# Patient Record
Sex: Male | Born: 1970 | Race: White | Hispanic: No | Marital: Married | Smoking: Never smoker
Health system: Southern US, Community
[De-identification: ages and names within clinical notes are randomized; demographics above are authoritative.]

## PROBLEM LIST (undated history)

## (undated) HISTORY — PX: NO PAST SURGERIES: SHX2092

---

## 2016-03-16 ENCOUNTER — Ambulatory Visit (INDEPENDENT_AMBULATORY_CARE_PROVIDER_SITE_OTHER): Payer: BLUE CROSS/BLUE SHIELD | Admitting: Otolaryngology

## 2016-03-16 DIAGNOSIS — J330 Polyp of nasal cavity: Secondary | ICD-10-CM

## 2016-03-16 DIAGNOSIS — J342 Deviated nasal septum: Secondary | ICD-10-CM

## 2016-03-16 DIAGNOSIS — J310 Chronic rhinitis: Secondary | ICD-10-CM | POA: Diagnosis not present

## 2016-03-16 DIAGNOSIS — J343 Hypertrophy of nasal turbinates: Secondary | ICD-10-CM | POA: Diagnosis not present

## 2016-03-18 ENCOUNTER — Other Ambulatory Visit (INDEPENDENT_AMBULATORY_CARE_PROVIDER_SITE_OTHER): Payer: Self-pay | Admitting: Otolaryngology

## 2016-03-18 DIAGNOSIS — J328 Other chronic sinusitis: Secondary | ICD-10-CM

## 2016-04-09 ENCOUNTER — Ambulatory Visit
Admission: RE | Admit: 2016-04-09 | Discharge: 2016-04-09 | Disposition: A | Discharge status: Home / Self Care | Payer: BLUE CROSS/BLUE SHIELD | Source: Ambulatory Visit | Attending: Otolaryngology | Admitting: Otolaryngology

## 2016-04-09 DIAGNOSIS — J328 Other chronic sinusitis: Secondary | ICD-10-CM

## 2017-12-15 DIAGNOSIS — J209 Acute bronchitis, unspecified: Secondary | ICD-10-CM | POA: Diagnosis not present

## 2018-03-18 DIAGNOSIS — Z23 Encounter for immunization: Secondary | ICD-10-CM | POA: Diagnosis not present

## 2018-05-04 DIAGNOSIS — R0681 Apnea, not elsewhere classified: Secondary | ICD-10-CM | POA: Diagnosis not present

## 2018-05-04 DIAGNOSIS — G2581 Restless legs syndrome: Secondary | ICD-10-CM | POA: Diagnosis not present

## 2019-03-28 ENCOUNTER — Ambulatory Visit: Payer: Medicaid Other | Attending: Internal Medicine

## 2019-03-28 DIAGNOSIS — Z20822 Contact with and (suspected) exposure to covid-19: Secondary | ICD-10-CM

## 2019-03-29 LAB — NOVEL CORONAVIRUS, NAA: SARS-CoV-2, NAA: NOT DETECTED

## 2019-12-06 ENCOUNTER — Telehealth (HOSPITAL_COMMUNITY): Payer: Self-pay

## 2019-12-06 ENCOUNTER — Other Ambulatory Visit: Payer: Self-pay | Admitting: Family

## 2019-12-06 ENCOUNTER — Ambulatory Visit (HOSPITAL_COMMUNITY)
Admission: RE | Admit: 2019-12-06 | Discharge: 2019-12-06 | Disposition: A | Discharge status: Home / Self Care | Payer: 59 | Source: Ambulatory Visit | Attending: Pulmonary Disease | Admitting: Pulmonary Disease

## 2019-12-06 DIAGNOSIS — U071 COVID-19: Secondary | ICD-10-CM | POA: Diagnosis not present

## 2019-12-06 DIAGNOSIS — Z6825 Body mass index (BMI) 25.0-25.9, adult: Secondary | ICD-10-CM | POA: Insufficient documentation

## 2019-12-06 MED ORDER — METHYLPREDNISOLONE SODIUM SUCC 125 MG IJ SOLR: 125.0000 mg | Freq: Once | INTRAMUSCULAR | Status: DC | PRN

## 2019-12-06 MED ORDER — DIPHENHYDRAMINE HCL 50 MG/ML IJ SOLN: 50.0000 mg | Freq: Once | INTRAMUSCULAR | Status: DC | PRN

## 2019-12-06 MED ORDER — EPINEPHRINE 0.3 MG/0.3ML IJ SOAJ: 0.3000 mg | Freq: Once | INTRAMUSCULAR | Status: DC | PRN

## 2019-12-06 MED ORDER — FAMOTIDINE IN NACL 20-0.9 MG/50ML-% IV SOLN: 20.0000 mg | Freq: Once | INTRAVENOUS | Status: DC | PRN

## 2019-12-06 MED ORDER — SODIUM CHLORIDE 0.9 % IV SOLN: INTRAVENOUS | Status: DC | PRN

## 2019-12-06 MED ORDER — SODIUM CHLORIDE 0.9 % IV SOLN: Freq: Once | INTRAVENOUS | Status: AC

## 2019-12-06 MED ORDER — ALBUTEROL SULFATE HFA 108 (90 BASE) MCG/ACT IN AERS: 2.0000 | INHALATION_SPRAY | Freq: Once | RESPIRATORY_TRACT | Status: DC | PRN

## 2019-12-06 NOTE — Discharge Instructions (Signed)
10 Things You Can Do to Manage Your COVID-19 Symptoms at Home If you have possible or confirmed COVID-19: 1. Stay home from work and school. And stay away from other public places. If you must go out, avoid using any kind of public transportation, ridesharing, or taxis. 2. Monitor your symptoms carefully. If your symptoms get worse, call your healthcare provider immediately. 3. Get rest and stay hydrated. 4. If you have a medical appointment, call the healthcare provider ahead of time and tell them that you have or may have COVID-19. 5. For medical emergencies, call 911 and notify the dispatch personnel that you have or may have COVID-19. 6. Cover your cough and sneezes with a tissue or use the inside of your elbow. 7. Wash your hands often with soap and water for at least 20 seconds or clean your hands with an alcohol-based hand sanitizer that contains at least 60% alcohol. 8. As much as possible, stay in a specific room and away from other people in your home. Also, you should use a separate bathroom, if available. If you need to be around other people in or outside of the home, wear a mask. 9. Avoid sharing personal items with other people in your household, like dishes, towels, and bedding. 10. Clean all surfaces that are touched often, like counters, tabletops, and doorknobs. Use household cleaning sprays or wipes according to the label instructions.Mr. What types of side effects do monoclonal antibody drugs cause?  Common side effects  In general, the more common side effects caused by monoclonal antibody drugs include: Allergic reactions, such as hives or itching Flu-like signs and symptoms, including chills, fatigue, fever, and muscle aches and pains Nausea, vomiting Diarrhea Skin rashes Low blood pressure   The CDC is recommending patients who receive monoclonal antibody treatments wait at least 90 days before being vaccinated.  11. Currently, there are no data on the safety and  efficacy of mRNA COVID-19 vaccines in persons who received monoclonal antibodies or convalescent plasma as part of COVID-19 treatment. Based on the estimated half-life of such therapies as well as evidence suggesting that reinfection is uncommon in the 90 days after initial infection, vaccination should be deferred for at least 90 days, as a precautionary measure until additional information becomes available, to avoid interference of the antibody treatment with vaccine-induced immune responses. [employer] 08/10/2018 This information is not intended to replace advice given to you by your health care provider. Make sure you discuss any questions you have with your health care provider. Document Revised: 01/12/2019 Document Reviewed: 01/12/2019 Elsevier Patient Education  2020 [employer].

## 2019-12-06 NOTE — Progress Notes (Signed)
I connected by phone with Russell Simmons on 12/06/2019 at 3:30 PM to discuss the potential use of a new treatment for mild to moderate COVID-19 viral infection in non-hospitalized patients.  This patient is a 49 y.o. male that meets the FDA criteria for Emergency Use Authorization of COVID monoclonal antibody casirivimab/imdevimab or bamlanivimab/eteseviamb.  Has a (+) direct SARS-CoV-2 viral test result  Has mild or moderate COVID-19   Is NOT hospitalized due to COVID-19  Is within 10 days of symptom onset  Has at least one of the high risk factor(s) for progression to severe COVID-19 and/or hospitalization as defined in EUA.  Specific high risk criteria : BMI > 25 Symptoms of stuffy nose, aches, decreased appetite, and burnt smell in nose began 12/02/19.   I have spoken and communicated the following to the patient or parent/caregiver regarding COVID monoclonal antibody treatment:  1. FDA has authorized the emergency use for the treatment of mild to moderate COVID-19 in adults and pediatric patients with positive results of direct SARS-CoV-2 viral testing who are 35 years of age and older weighing at least 40 kg, and who are at high risk for progressing to severe COVID-19 and/or hospitalization.  2. The significant known and potential risks and benefits of COVID monoclonal antibody, and the extent to which such potential risks and benefits are unknown.  3. Information on available alternative treatments and the risks and benefits of those alternatives, including clinical trials.  4. Patients treated with COVID monoclonal antibody should continue to self-isolate and use infection control measures (e.g., wear mask, isolate, social distance, avoid sharing personal items, clean and disinfect "high touch" surfaces, and frequent handwashing) according to CDC guidelines.   5. The patient or parent/caregiver has the option to accept or refuse COVID monoclonal antibody treatment.  After  reviewing this information with the patient, the patient has agreed to receive one of the available covid 19 monoclonal antibodies and will be provided an appropriate fact sheet prior to infusion. [name], NP 12/06/2019 3:30 PM

## 2019-12-06 NOTE — Progress Notes (Signed)
  Diagnosis: COVID-19  Physician: Dr. Wright  Procedure: Covid Infusion Clinic Med: bamlanivimab\etesevimab infusion - Provided patient with bamlanimivab\etesevimab fact sheet for patients, parents and caregivers prior to infusion.  Complications: No immediate complications noted.  Discharge: Discharged home   Aireonna Bauer Ann 12/06/2019  

## 2019-12-06 NOTE — Telephone Encounter (Signed)
Called to Discuss with patient about Covid symptoms and the use of the monoclonal antibody infusion for those with mild to moderate Covid symptoms and at a high risk of hospitalization.     Pt appears to qualify for this infusion due to co-morbid conditions and/or a member of an at-risk group in accordance with the FDA Emergency Use Authorization.    Patient interested in treatment.    DAY 4. Pre-screended by RN and ready for APP. Sx onset 10/23. Test (+) 10/25 at [facility]. Risk Factor: BMI over 25.

## 2020-09-17 DIAGNOSIS — H9313 Tinnitus, bilateral: Secondary | ICD-10-CM | POA: Insufficient documentation

## 2020-09-17 DIAGNOSIS — H833X3 Noise effects on inner ear, bilateral: Secondary | ICD-10-CM | POA: Insufficient documentation

## 2020-10-08 NOTE — Progress Notes (Signed)
Subjective:    CC: R shoulder pain  I, Russell Simmons, LAT, ATC, am serving as scribe for Dr. Clementeen Graham.  HPI: Pt is a 50 y/o male presenting w/ c/o R shoulder pain since Jan 2022 w/ no known MOI.  He locates his pain to his R ant shoulder.  He has completed 8 PT sessions at [facility] and has been referred to [place] by their office.  Physical therapy started in May 2022.  He has had to stop riding his mountain bike because of the shoulder plane.  Additionally he has had to stop of a Database administrator in his backyard.  He is building a large shed and cannot complete it because shoulder pain.  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    Radiating pain: No R shoulder mechanical symptoms: yes intermittently.  Patient notes some clunking and clicking in his shoulder at times. Aggravating factors: R shoulder extension AROM; washing his hands;  Treatments tried: PT x 8 visits; HEP  Pertinent review of Systems: No fevers or chills  Relevant historical information: Otherwise healthy   Objective:    Vitals:   10/09/20 0920  BP: 120/86  Pulse: 100  SpO2: 98%   General: Well Developed, well nourished, and in no acute distress.   MSK: Right shoulder normal-appearing Normal motion pain with abduction and internal rotation. Strength intact abduction external and internal rotation. Positive empty can test. Positive Hawkins and Neer's test. Positive crossover arm compression test. Positive O'Brien's test. Positive clunk test.  Pulses cap refill and sensation are intact distally.  Lab and Radiology Results  Diagnostic Limited MSK Ultrasound of: Right shoulder Biceps tendon intact normal-appearing Subscapularis tendon is intact. Trace hypoechoic fluid tracks superficial to the subscap tendon. Supraspinatus tendon is intact with moderate subacromial fluid tracking superficial to tendon. Infraspinatus tendon appears to  be intact. AC joint large effusion. Impression: Subacromial bursitis and AC DJD.  X-ray images right shoulder obtained today personally and independently interpreted Significant degeneration with possible old fracture at [facility] joint. No glenohumeral DJD.  No acute fractures are visible. Await formal radiology review   Impression and Recommendations:    Assessment and Plan: 50 y.o. male with chronic right shoulder pain ongoing for approximately 6 months occurring without injury.  Patient has had extensive trials of physical therapy with 8 sessions since May 2022.  Additionally he has been completing home exercise program every day for at least 10 minutes with little benefit.  At this point plan to proceed to advanced imaging to further characterize cause of pain.  Given his mechanical symptoms we will proceed to MRI arthrogram for potential surgical or injection planning.  Addition of this should help [place] know if further physical therapy should be helpful.  Plan for x-ray and then proceed to MRI arthrogram.  Recheck after MRI.  Send notes to [facility].. Fax:  [phone]  PDMP not reviewed this encounter. Orders Placed This Encounter  Procedures   [place] LIMITED JOINT SPACE STRUCTURES UP RIGHT(NO LINKED CHARGES)    Order Specific Question:   Reason for Exam (SYMPTOM  OR DIAGNOSIS REQUIRED)    Answer:   R shoulder pain    Order Specific Question:   Preferred imaging location?    Answer:   [facility] Shoulder Right    Standing Status:   Future    Number of Occurrences:   1    Standing Expiration  Date:   10/09/2021    Order Specific Question:   Reason for Exam (SYMPTOM  OR DIAGNOSIS REQUIRED)    Answer:   eval shoulder pain    Order Specific Question:   Preferred imaging location?    Answer:   [facility]   MR Shoulder Right W Contrast    MRI arthrogram. No IV contrast. Schedule with Dr T 1 hr prior to MRI for injection    Standing  Status:   Future    Standing Expiration Date:   10/09/2021    Scheduling Instructions:     MRI arthrogram. No IV contrast. Schedule with Dr T 1 hr prior to MRI for injection    Order Specific Question:   If indicated for the ordered procedure, I authorize the administration of contrast media per Radiology protocol    Answer:   Yes    Order Specific Question:   What is the patient's sedation requirement?    Answer:   No Sedation    Order Specific Question:   Does the patient have a pacemaker or implanted devices?    Answer:   No    Order Specific Question:   Preferred imaging location?    Answer:   [facility] (table limit-350lbs)   No orders of the defined types were placed in this encounter.   Discussed warning signs or symptoms. Please see discharge instructions. Patient expresses understanding.   The above documentation has been reviewed and is accurate and complete Clementeen Graham, M.D.

## 2020-10-09 ENCOUNTER — Ambulatory Visit (INDEPENDENT_AMBULATORY_CARE_PROVIDER_SITE_OTHER): Payer: 59 | Admitting: Family Medicine

## 2020-10-09 ENCOUNTER — Ambulatory Visit: Payer: Self-pay

## 2020-10-09 ENCOUNTER — Ambulatory Visit (INDEPENDENT_AMBULATORY_CARE_PROVIDER_SITE_OTHER): Payer: 59

## 2020-10-09 ENCOUNTER — Other Ambulatory Visit: Payer: Self-pay

## 2020-10-09 VITALS — BP 120/86 | HR 100 | Ht 74.0 in | Wt 205.2 lb

## 2020-10-09 DIAGNOSIS — M25511 Pain in right shoulder: Secondary | ICD-10-CM | POA: Diagnosis not present

## 2020-10-09 DIAGNOSIS — G8929 Other chronic pain: Secondary | ICD-10-CM | POA: Diagnosis not present

## 2020-10-09 NOTE — Patient Instructions (Signed)
Thank you for coming in today.   Please get an Xray today before you leave   You should hear from MRI scheduling within 1 week. If you do not hear please let me know.    Recheck after the MRI results come back.

## 2020-10-11 NOTE — Progress Notes (Signed)
Right shoulder x-ray shows some arthritis of the small joint at the top of the shoulder.  The [facility] joint.

## 2020-10-21 ENCOUNTER — Ambulatory Visit (INDEPENDENT_AMBULATORY_CARE_PROVIDER_SITE_OTHER): Payer: 59

## 2020-10-21 ENCOUNTER — Ambulatory Visit: Payer: Self-pay

## 2020-10-21 ENCOUNTER — Ambulatory Visit (INDEPENDENT_AMBULATORY_CARE_PROVIDER_SITE_OTHER): Payer: 59 | Admitting: Sports Medicine

## 2020-10-21 DIAGNOSIS — G8929 Other chronic pain: Secondary | ICD-10-CM | POA: Diagnosis not present

## 2020-10-21 DIAGNOSIS — M25511 Pain in right shoulder: Secondary | ICD-10-CM | POA: Diagnosis not present

## 2020-10-21 NOTE — Assessment & Plan Note (Signed)
Pleasant 50 year old male, chronic right shoulder pain, injection performed for MR arthrography, further management per primary treating provider.

## 2020-10-21 NOTE — Progress Notes (Signed)
    Procedures performed today:    Procedure: Real-time Ultrasound Guided gadolinium contrast injection of right glenohumeral joint Device: Samsung HS60  Verbal informed consent obtained.  Time-out conducted.  Noted no overlying erythema, induration, or other signs of local infection.  Skin prepped in a sterile fashion.  Local anesthesia: Topical Ethyl chloride.  With sterile technique and under real time ultrasound guidance: Noted some fraying of the articular surface of the infraspinatus, using a 22-gauge spinal needle advanced into the glenohumeral joint from posterior approach, injected 1 cc kenalog 40, 2 cc lidocaine, 2 cc bupivacaine, syringe switched and 0.1 cc gadolinium injected, syringe again switched and 10 cc sterile saline used to fully distend the joint. Joint visualized and capsule seen distending confirming intra-articular placement of contrast material and medication. Completed without difficulty  Advised to call if fevers/chills, erythema, induration, drainage, or persistent bleeding.  Images permanently stored in PACS Impression: Technically successful ultrasound guided gadolinium contrast injection for MR arthrography.  Please see separate MR arthrogram report.  Independent interpretation of notes and tests performed by another provider:   None.  Brief History, Exam, Impression, and Recommendations:    Chronic right shoulder pain Pleasant 50 year old male, chronic right shoulder pain, injection performed for MR arthrography, further management per primary treating provider.    ___________________________________________ Ihor Austin. Benjamin Stain, M.D., ABFM., CAQSM. Primary Care and Sports Medicine [facility]  Adjunct Instructor of Family Medicine  [employer]

## 2020-10-23 NOTE — Progress Notes (Signed)
Right shoulder MRI shows a partial rotator cuff tear and a small labrum tear.  These both could cause the pain and shoulder instability that you are experiencing.  Since you have failed to improve with physical therapy it may be reasonable to consider surgery at this point.  Would you like me to refer you to orthopedic surgery to discuss surgical options?  Alternatively you could return to clinic and we can talk about the MRI and your options in further detail.

## 2020-10-24 NOTE — Progress Notes (Signed)
I, Russell Simmons, LAT, ATC, am serving as scribe for Dr. Clementeen Graham.  Russell Simmons is a 50 y.o. male who presents to [facility] today for f/u of R ant shoulder pain and R shoulder MR arthrogram review.  He was last seen by Dr. Denyse Amass on 10/09/20 after completing a course of PT at [facility], noting con't pain in his R shoulder.  Due to failing conservative management, he was referred for advanced imaging.  Today, pt reports that his R shoulder is feeling better after having the injection associated w/ the MRA.  He has started using topical diclofenac.  He is interested in discussing options moving forward including PRP, stem cell therapy, surgery, etc.  Diagnostic imaging: R shoulder MRI arthrogram- 10/21/20; R shoulder XR- 10/09/20  Pertinent review of systems: No fevers or chills  Relevant historical information: Tinnitus.   Exam:  BP (!) 148/80 (BP Location: Left Arm, Patient Position: Sitting, Cuff Size: Normal)   Pulse 78   Ht 6\' 2"  (1.88 m)   Wt 203 lb (92.1 kg)   SpO2 98%   BMI 26.06 kg/m  General: Well Developed, well nourished, and in no acute distress.   MSK: Right shoulder.  Normal-appearing normal motion pain with abduction.    Lab and Radiology Results No results found for this or any previous visit (from the past 72 hour(s)). MR Shoulder Right W Contrast  Result Date: 10/22/2020 CLINICAL DATA:  Shoulder pain, labral tear suspected, nondiagnostic xray EXAM: MR ARTHROGRAM OF THE right SHOULDER TECHNIQUE: Multiplanar, multisequence MR imaging of the right shoulder was performed following the administration of intra-articular contrast. CONTRAST:  See Injection Documentation. COMPARISON:  None. FINDINGS: Rotator cuff: There is distal supraspinatus tendinosis with a high-grade, partial width bursal sided tear of the supraspinatus tendon at the footprint (coronal T2 image 12). Infraspinatus tendon is intact. Teres minor tendon is intact.  Subscapularis tendon is intact. Muscles: No muscle atrophy or edema. No intramuscular fluid collection or hematoma. Biceps Long Head: Intraarticular and extraarticular portions of the biceps tendon are intact. Acromioclavicular Joint: Mild arthropathy of the acromioclavicular joint. No subacromial/subdeltoid bursal fluid. Glenohumeral Joint: Adequatedistension of the glenohumeral joint. Labrum: Probable small nondisplaced labral tear just posterior to the biceps labral anchor (coronal T2 and T1 image 11). Bones: No fracture or dislocation. No aggressive osseous lesion. Other: No fluid collection or hematoma. IMPRESSION: Distal supraspinatus tendinosis with a high-grade, partial width bursal sided tear at the footprint. No significant muscle atrophy. Mild subacromial-subdeltoid bursitis. Probable small nondisplaced labral tear just posterior to the biceps labral anchor. Mild AC joint arthropathy. Electronically Signed   By: 10/24/2020 M.D.   On: 10/22/2020 16:08   10/24/2020 LIMITED JOINT SPACE STRUCTURES UP RIGHT  Result Date: 10/21/2020 Procedure: Real-time Ultrasound Guided gadolinium contrast injection of right glenohumeral joint Device: Samsung HS60 Verbal informed consent obtained. Time-out conducted. Noted no overlying erythema, induration, or other signs of local infection. Skin prepped in a sterile fashion. Local anesthesia: Topical Ethyl chloride. With sterile technique and under real time ultrasound guidance: Noted some fraying of the articular surface of the infraspinatus, using a 22-gauge spinal needle advanced into the glenohumeral joint from posterior approach, injected 1 cc kenalog 40, 2 cc lidocaine, 2 cc bupivacaine, syringe switched and 0.1 cc gadolinium injected, syringe again switched and 10 cc sterile saline used to fully distend the joint. Joint visualized and capsule seen distending confirming intra-articular placement of contrast material and medication. Completed without difficulty Advised to  call if fevers/chills, erythema, induration, drainage, or persistent bleeding. Images permanently stored in PACS Impression: Technically successful ultrasound guided gadolinium contrast injection for MR arthrography.  Please see separate MR arthrogram report.   I, Clementeen Graham, personally (independently) visualized and performed the interpretation of the MRI images attached in this note.    Assessment and Plan: 50 y.o. male with right shoulder pain.  Multifactorial.  Majority the pain due to the small supraspinatus rotator cuff tear.  The labrum tear could be a source of pain as well but is probably less dominant.  He would like to avoid surgery but is willing to consider it.  He would also like to consider other options.  Plan titrate nitroglycerin patch protocol and if not better proceed to PRP.  Also refer to orthopedic surgery to discuss surgical options and potentially plan for surgery late December.  Recheck in a month if not better or improving with nitroglycerin patch protocol proceed to PRP.  That does this time to ultimately proceed to surgery if not improved.   PDMP not reviewed this encounter. Orders Placed This Encounter  Procedures   Ambulatory referral to Orthopedic Surgery    Referral Priority:   Routine    Referral Type:   Surgical    Referral Reason:   Specialty Services Required    Referred to Provider:   Bjorn Pippin, MD    Requested Specialty:   Orthopedic Surgery    Number of Visits Requested:   1   Meds ordered this encounter  Medications   nitroGLYCERIN (NITRODUR - DOSED IN MG/24 HR) 0.2 mg/hr patch    Sig: Apply 1/4 patch daily to tendon for tendonitis.    Dispense:  30 patch    Refill:  1     Discussed warning signs or symptoms. Please see discharge instructions. Patient expresses understanding.   The above documentation has been reviewed and is accurate and complete Clementeen Graham, M.D.

## 2020-10-25 ENCOUNTER — Other Ambulatory Visit: Payer: Self-pay

## 2020-10-25 ENCOUNTER — Ambulatory Visit (INDEPENDENT_AMBULATORY_CARE_PROVIDER_SITE_OTHER): Payer: 59 | Admitting: Family Medicine

## 2020-10-25 ENCOUNTER — Encounter: Payer: Self-pay | Admitting: Family Medicine

## 2020-10-25 VITALS — BP 148/80 | HR 78 | Ht 74.0 in | Wt 203.0 lb

## 2020-10-25 DIAGNOSIS — G8929 Other chronic pain: Secondary | ICD-10-CM | POA: Diagnosis not present

## 2020-10-25 DIAGNOSIS — M25511 Pain in right shoulder: Secondary | ICD-10-CM

## 2020-10-25 MED ORDER — NITROGLYCERIN 0.2 MG/HR TD PT24: MEDICATED_PATCH | TRANSDERMAL | 1 refills | Status: AC

## 2020-10-25 NOTE — Patient Instructions (Addendum)
Thank you for coming in today.   Nitroglycerin Protocol  Apply 1/4 nitroglycerin patch to affected area daily. Change position of patch within the affected area every 24 hours. You may experience a headache during the first 1-2 weeks of using the patch, these should subside. If you experience headaches after beginning nitroglycerin patch treatment, you may take your preferred over the counter pain reliever. Another side effect of the nitroglycerin patch is skin irritation or rash related to patch adhesive. Please notify our office if you develop more severe headaches or rash, and stop the patch. Tendon healing with nitroglycerin patch may require 12 to 24 weeks depending on the extent of injury. Men should not use if taking Viagra, Cialis, or Levitra.  Do not use if you have migraines or rosacea.     You should hear from Dr Everardo Pacific soon.   If notro patches are not working we can do PRP.   Recheck in 1 month.   Continue exercises.

## 2020-11-18 NOTE — Progress Notes (Signed)
   I, Philbert Riser, LAT, ATC acting as a scribe for Clementeen Graham, MD.  Russell Simmons is a 50 y.o. male who presents to [facility] at [facility] today for f/u chronic R shoulder pain thought to predominately due to a small supraspinatus tear. Pt was last seen by Dr. Denyse Amass on 10/25/20 and was advised to titrate nitroglycerin patch protocol and if not better proceed to PRP. Pt was also referred to Orthopedic Surgery (Dr. Everardo Pacific) for consultation/surgical planning. Today, pt reports his shoulder surgery is scheduled for 01/06/21. Pt agreed w/ Dr. Zollie Pee recommendation for surgery. Pt is interest in stretching and strengthening prior to surgery.  Dx imaging: 10/21/20 R shoulder MRA  10/09/20 R shoulder XR   Pertinent review of systems: No fevers or chills  Relevant historical information: Tinnitus.  Shoulder surgery scheduled for late November.   Exam:  BP (!) 158/98   Pulse 90   Ht 6\' 2"  (1.88 m)   Wt 207 lb 6.4 oz (94.1 kg)   SpO2 98%   BMI 26.63 kg/m  General: Well Developed, well nourished, and in no acute distress.   MSK: Right shoulder normal-appearing normal motion.    Lab and Radiology Results EXAM: MR ARTHROGRAM OF THE right SHOULDER   TECHNIQUE: Multiplanar, multisequence MR imaging of the right shoulder was performed following the administration of intra-articular contrast.   CONTRAST:  See Injection Documentation.   COMPARISON:  None.   FINDINGS: Rotator cuff: There is distal supraspinatus tendinosis with a high-grade, partial width bursal sided tear of the supraspinatus tendon at the footprint (coronal T2 image 12). Infraspinatus tendon is intact. Teres minor tendon is intact. Subscapularis tendon is intact.   Muscles: No muscle atrophy or edema. No intramuscular fluid collection or hematoma.   Biceps Long Head: Intraarticular and extraarticular portions of the biceps tendon are intact.   Acromioclavicular Joint: Mild arthropathy of  the acromioclavicular joint. No subacromial/subdeltoid bursal fluid.   Glenohumeral Joint: Adequatedistension of the glenohumeral joint.   Labrum: Probable small nondisplaced labral tear just posterior to the biceps labral anchor (coronal T2 and T1 image 11).   Bones: No fracture or dislocation. No aggressive osseous lesion.   Other: No fluid collection or hematoma.   IMPRESSION: Distal supraspinatus tendinosis with a high-grade, partial width bursal sided tear at the footprint. No significant muscle atrophy.   Mild subacromial-subdeltoid bursitis.   Probable small nondisplaced labral tear just posterior to the biceps labral anchor.   Mild AC joint arthropathy.     Electronically Signed   By: M.D.   On: 10/22/2020 16:08 I, 10/24/2020, personally (independently) visualized and performed the interpretation of the images attached in this note.    Assessment and Plan: 49 y.o. male with right shoulder pain due to rotator cuff tear and labrum tear.  Patient had shoulder surgery scheduled for about 6 weeks from now.  Discussed activity restriction until surgery.  Additionally ATC taught home Pre-Hab exercises.  Goal for rotator cuff and periscapular strengthening and preservation of range of motion prior to his surgery. Recheck as needed.     Discussed warning signs or symptoms. Please see discharge instructions. Patient expresses understanding.   The above documentation has been reviewed and is accurate and complete 54, M.D.

## 2020-11-19 ENCOUNTER — Other Ambulatory Visit: Payer: Self-pay

## 2020-11-19 ENCOUNTER — Ambulatory Visit: Payer: 59 | Admitting: Family Medicine

## 2020-11-19 VITALS — BP 158/98 | HR 90 | Ht 74.0 in | Wt 207.4 lb

## 2020-11-19 DIAGNOSIS — M25511 Pain in right shoulder: Secondary | ICD-10-CM

## 2020-11-19 DIAGNOSIS — G8929 Other chronic pain: Secondary | ICD-10-CM | POA: Diagnosis not present

## 2020-11-19 NOTE — Patient Instructions (Addendum)
Thank you for coming in today.   Works on shoulder strengthening prior to surgery: View at [email or web address] using code: [email or web address]  Best wishes w/ the surgery!  Recheck back as needed

## 2021-03-11 ENCOUNTER — Other Ambulatory Visit: Payer: Self-pay | Admitting: Physician Assistant

## 2021-03-11 DIAGNOSIS — R590 Localized enlarged lymph nodes: Secondary | ICD-10-CM

## 2021-04-04 ENCOUNTER — Ambulatory Visit
Admission: RE | Admit: 2021-04-04 | Discharge: 2021-04-04 | Disposition: A | Discharge status: Home / Self Care | Payer: 59 | Source: Ambulatory Visit | Attending: Physician Assistant | Admitting: Physician Assistant

## 2021-04-04 ENCOUNTER — Other Ambulatory Visit: Payer: Self-pay

## 2021-04-04 DIAGNOSIS — R590 Localized enlarged lymph nodes: Secondary | ICD-10-CM

## 2021-04-09 ENCOUNTER — Other Ambulatory Visit: Payer: 59

## 2021-08-11 ENCOUNTER — Telehealth: Payer: Self-pay | Admitting: Licensed Clinical Social Worker

## 2021-08-11 NOTE — Telephone Encounter (Signed)
Scheduled appt per 7/3 referral. Pt is aware of appt date and time. Pt is aware to arrive 15 mins prior to appt time and to bring and updated insurance card. Pt is aware of appt location.   

## 2021-09-22 ENCOUNTER — Inpatient Hospital Stay: Payer: 59 | Attending: Genetic Counselor | Admitting: Licensed Clinical Social Worker

## 2021-09-22 ENCOUNTER — Encounter: Payer: Self-pay | Admitting: Licensed Clinical Social Worker

## 2021-09-22 ENCOUNTER — Inpatient Hospital Stay: Payer: 59

## 2021-09-22 ENCOUNTER — Other Ambulatory Visit: Payer: Self-pay

## 2021-09-22 DIAGNOSIS — Z808 Family history of malignant neoplasm of other organs or systems: Secondary | ICD-10-CM

## 2021-09-22 DIAGNOSIS — Z800 Family history of malignant neoplasm of digestive organs: Secondary | ICD-10-CM

## 2021-09-22 NOTE — Progress Notes (Signed)
REFERRING PROVIDER: Almedia Balls, NP [address] [place],  [place] [address]  PRIMARY PROVIDER:  Pcp, No  PRIMARY REASON FOR VISIT:  1. Family history of pancreatic cancer   2. Family history of melanoma      HISTORY OF PRESENT ILLNESS:   Russell Simmons, a 51 y.o. male, was seen for a [place] cancer genetics consultation at the request of Dr. Livia Snellen due to a family history of pancreatic cancer.  Russell Simmons presents to clinic today to discuss the possibility of a hereditary predisposition to cancer, genetic testing, and to further clarify his future cancer risks, as well as potential cancer risks for family members.   CANCER HISTORY:  Russell Simmons is a 51 y.o. male with no personal history of cancer.    He had a normal colonoscopy and endoscopy recently. He does have regular skin exams.   Past Surgical History:  Procedure Laterality Date   NO PAST SURGERIES      FAMILY HISTORY:  We obtained a detailed, 4-generation family history.  Significant diagnoses are listed below: Family History  Problem Relation Age of Onset   Melanoma Mother    Colon polyps Father 25   Cancer Maternal Grandmother        pancreas   Cancer Maternal Grandfather        pancreas   Non-Hodgkin's lymphoma Cousin    Russell Simmons has 1 daughter, 7. He has 1 sister and 1 brother, no cancers.  Russell Simmons mother had melanoma in her 25s and is living at 63. Patient has 2 maternal aunts, 1 uncle, no cancers. A maternal cousin had Non Hogdkins Lymphoma. Maternal grandmother had pancreatic cancer that was identified early in her 85s. She passed at 25. Maternal grandfather had pancreatic cancer in his 65s and passed in his 75s.  Russell Simmons father is living at 60, no known cancers on this side of the family.  Russell Simmons is unaware of previous family history of genetic testing for hereditary cancer risks. There is no reported Ashkenazi Jewish ancestry. There is no known consanguinity.    GENETIC  COUNSELING ASSESSMENT: Russell Simmons is a 51 y.o. male with a family history of pancreatic cancer and melanoma which is somewhat suggestive of a hereditary cancer syndrome and predisposition to cancer. We, therefore, discussed and recommended the following at today's visit.   DISCUSSION: We discussed that approximately 10% of pancreatic cancer is hereditary. Most cases of hereditary BRCA1/BRCA2 cancer are associated with genes, although there are other genes associated with hereditary pancreatic cancer as well including CDKN2A which also elevates melanoma risk. Cancers and risks are gene specific. We discussed that testing is beneficial for several reasons including knowing about cancer risks, identifying potential screening and risk-reduction options that may be appropriate, and to understand if other family members could be at risk for cancer and allow them to undergo genetic testing.   We reviewed the characteristics, features and inheritance patterns of hereditary cancer syndromes. We also discussed genetic testing, including the appropriate family members to test, the process of testing, insurance coverage and turn-around-time for results. We discussed the implications of a negative, positive and/or variant of uncertain significant result. We recommended Russell Simmons pursue genetic testing for the [facility] Common Hereditary Cancers+RNA gene panel.   Based on Russell Simmons family history of cancer, he meets medical criteria for genetic testing. Despite that he meets criteria, he may still have an out of pocket cost. We discussed that if his out of pocket  cost for testing is over $100, the laboratory will call and confirm whether he wants to proceed with testing.  If the out of pocket cost of testing is less than $100 he will be billed by the genetic testing laboratory.   We discussed that some people do not want to undergo genetic testing due to fear of genetic discrimination.  A federal law called the  Genetic Information Non-Discrimination Act (GINA) of 2008 helps protect individuals against genetic discrimination based on their genetic test results.  It impacts both health insurance and employment.  For health insurance, it protects against increased premiums, being kicked off insurance or being forced to take a test in order to be insured.  For employment it protects against hiring, firing and promoting decisions based on genetic test results.  Health status due to a cancer diagnosis is not protected under GINA.  This law does not protect life insurance, disability insurance, or other types of insurance.   PLAN: After considering the risks, benefits, and limitations, Russell Simmons did not wish to pursue genetic testing at today's visit. We understand this decision and remain available to coordinate genetic testing at any time in the future. We, therefore, recommend Russell Simmons continue to follow the cancer screening guidelines given by his primary healthcare provider.  Russell Simmons questions were answered to his satisfaction today. Our contact information was provided should additional questions or concerns arise. Thank you for the referral and allowing [place] to share in the care of your patient.   Faith Rogue, MS, [facility] Genetic Counselor [email or web address] Phone: [phone]  The patient was seen for a total of 40 minutes in face-to-face genetic counseling. [employer] intern Veronia Beets was also present.  Dr. Grayland Ormond was available for discussion regarding this case.   _______________________________________________________________________ For Office Staff:  Number of people involved in session: 2 Was an Intern/ student involved with case: yes

## 2023-01-20 IMAGING — MR MR SHOULDER*R* W/CM
5 of 6 series · 33 of 40 positions shown · IV contrast (agent unspecified)
Comparison: None.

CLINICAL DATA: Shoulder pain, labral tear suspected, nondiagnostic
xray

EXAM:
MR ARTHROGRAM OF THE right SHOULDER
TECHNIQUE: Multiplanar, multisequence MR imaging of the right shoulder was
performed following the administration of intra-articular contrast.
CONTRAST:  See Injection Documentation.

[Series 3: T1 fat-sat · axial · 4.0mm · 0.55mm/px · z∈[-80,+35]mm · 9 of 24 slices shown (1 of 3)]
[im 1/24]
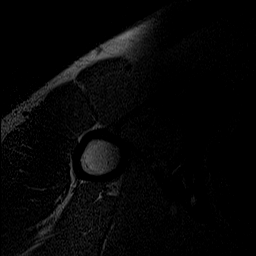
[im 3/24]
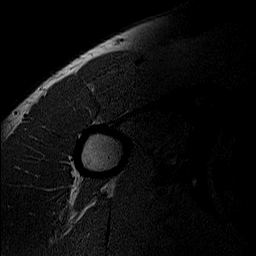
[im 6/24]
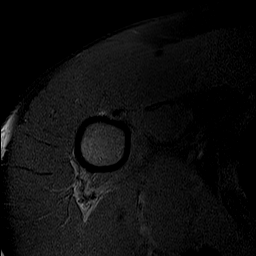
[im 9/24]
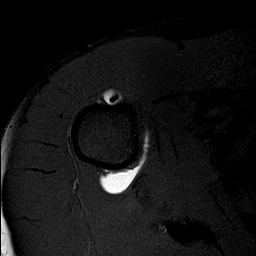
[im 12/24]
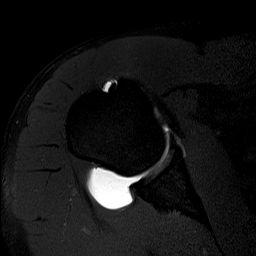
[im 15/24]
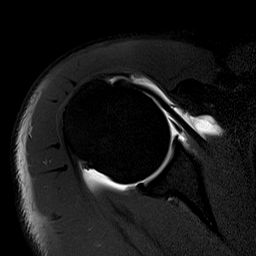
[im 18/24]
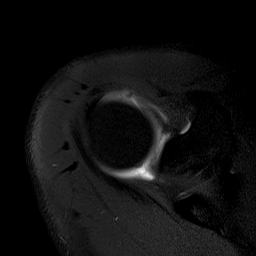
[im 21/24]
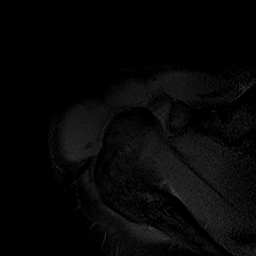
[im 24/24]
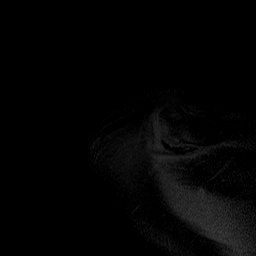

[Series 4: T1 fat-sat · oblique · 4.0mm · 0.27mm/px · 7 of 20 slices shown (2 of 3)]
[im 1/20]
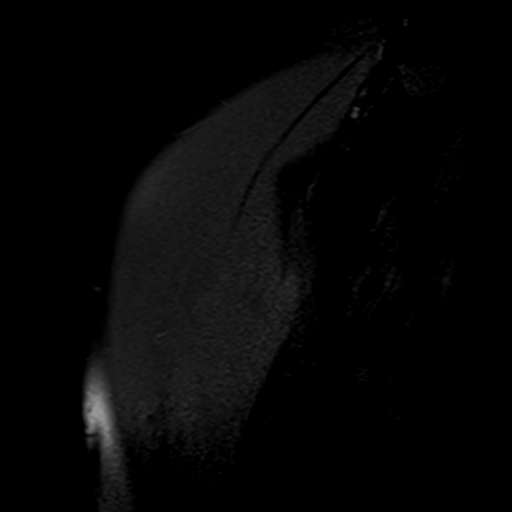
[im 4/20]
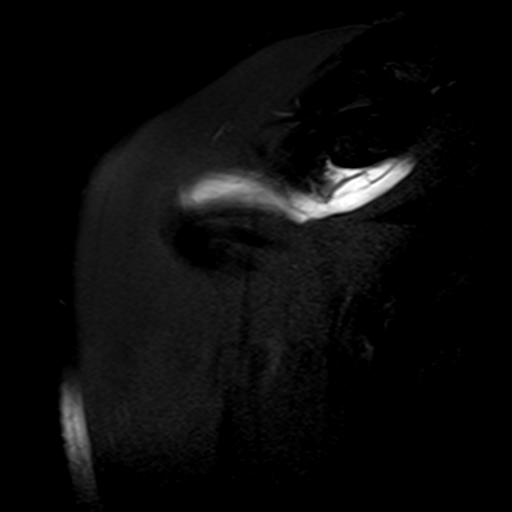
[im 7/20]
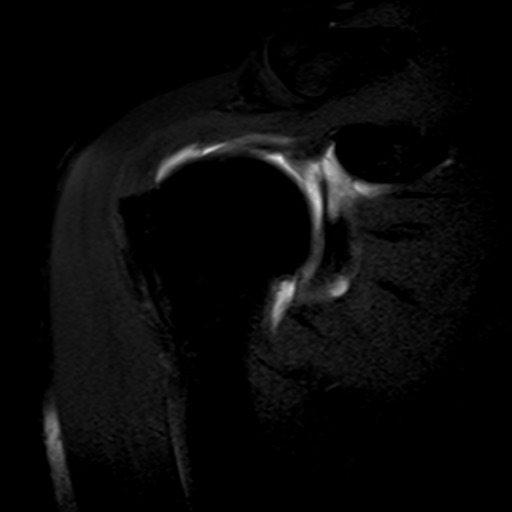
[im 10/20]
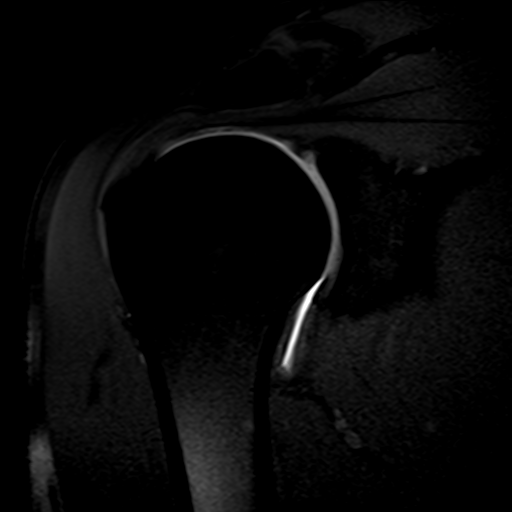
[im 13/20]
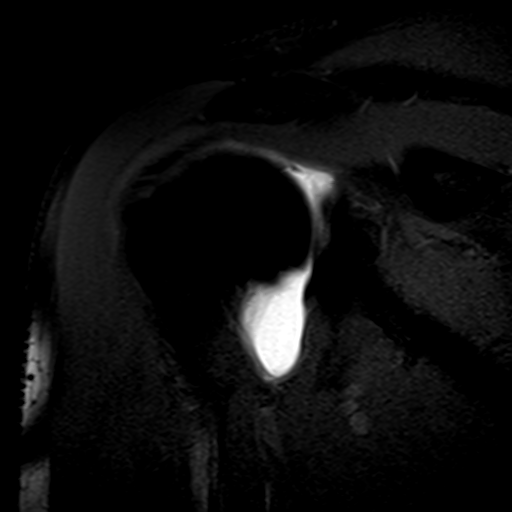
[im 16/20]
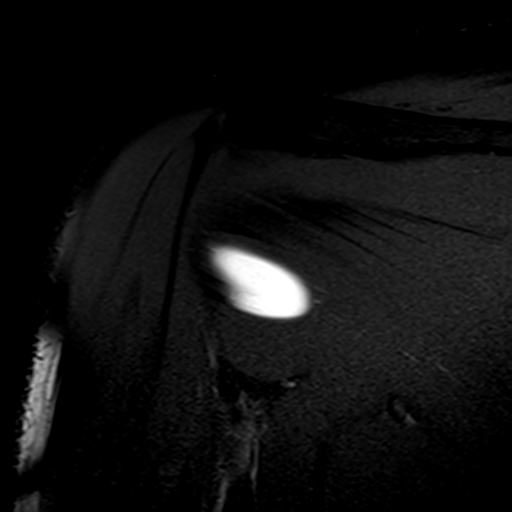
[im 20/20]
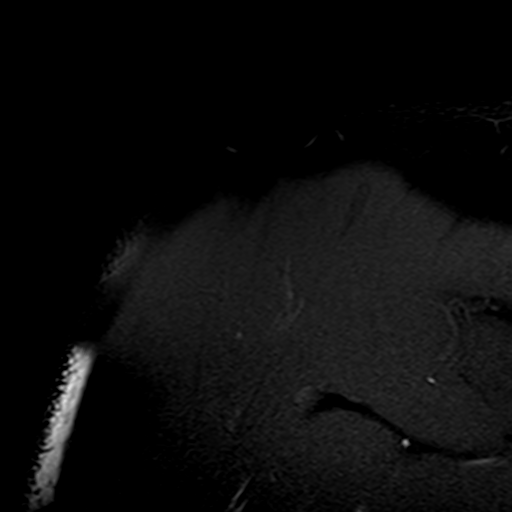

[Series 5: T2 fat-sat · oblique · 4.0mm · 0.55mm/px · 6 of 20 slices shown (1 of 2)]
[im 1/20]
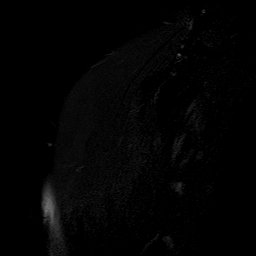
[im 4/20]
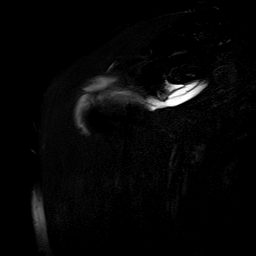
[im 8/20]
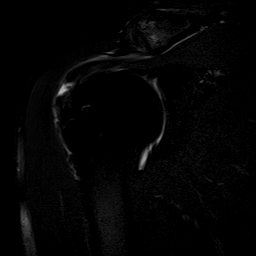
[im 12/20]
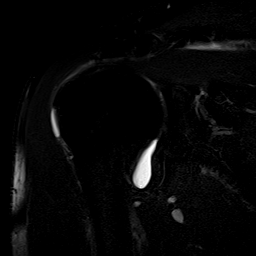
[im 16/20]
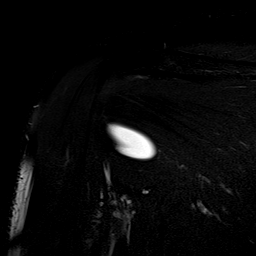
[im 20/20]
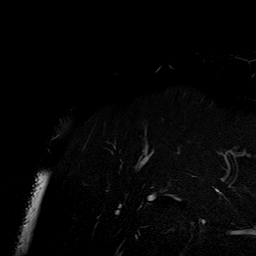

[Series 6: T1 fat-sat · oblique · non-contrast · 4.0mm · 0.55mm/px · 5 of 20 slices shown (3 of 3)]
[im 1/20]
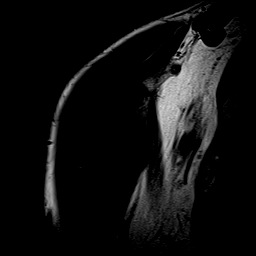
[im 4/20]
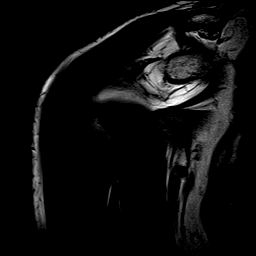
[im 8/20]
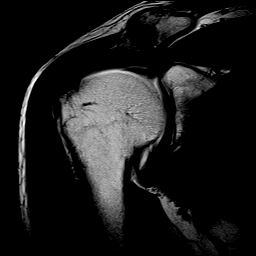
[im 12/20]
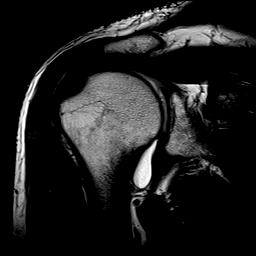
[im 16/20]
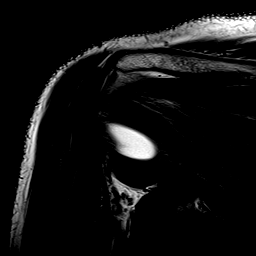

[Series 7: T2 fat-sat · oblique · 4.0mm · 0.27mm/px · 6 of 20 slices shown (2 of 2)]
[im 1/20]
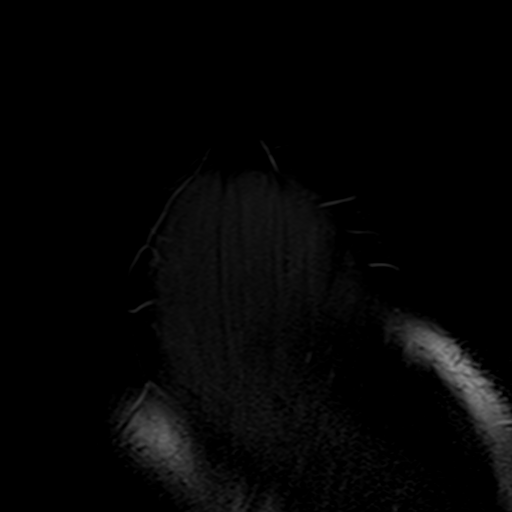
[im 4/20]
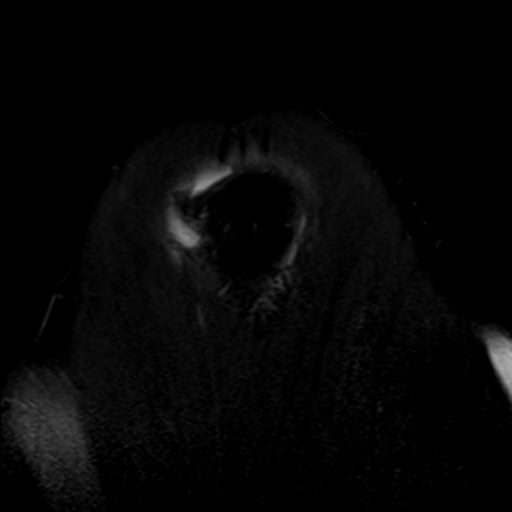
[im 8/20]
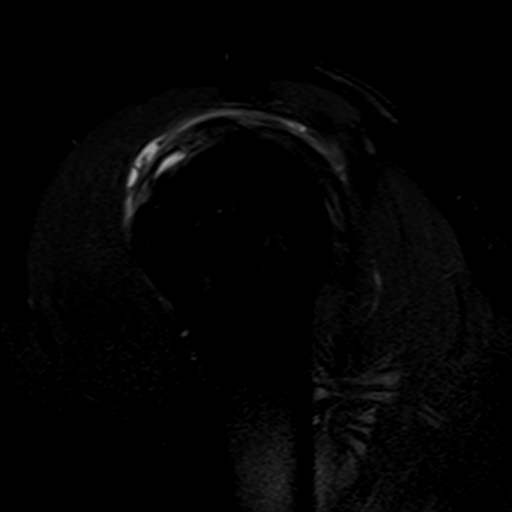
[im 12/20]
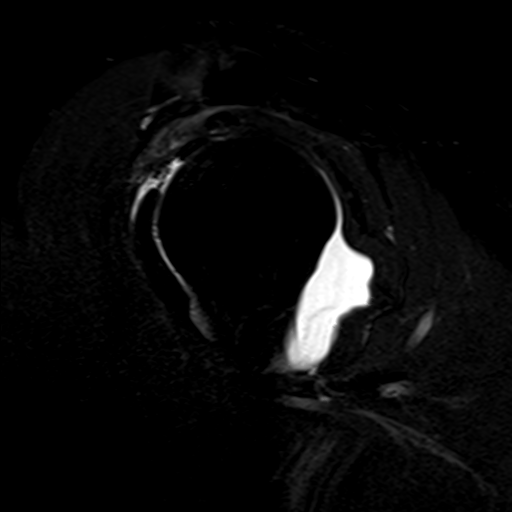
[im 16/20]
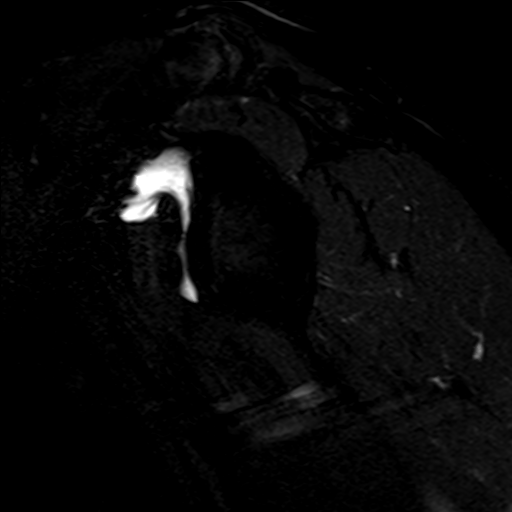
[im 20/20]
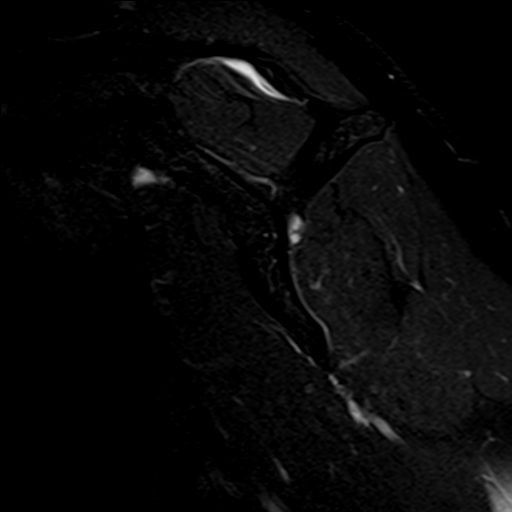

[33 of 40 positions shown; findings below may reference images not displayed]

FINDINGS: Rotator cuff: There is distal supraspinatus tendinosis with a
high-grade, partial width bursal sided tear of the supraspinatus
tendon at the footprint (coronal T2 image 12). Infraspinatus tendon
is intact. Teres minor tendon is intact. Subscapularis tendon is
intact.

Muscles: No muscle atrophy or edema. No intramuscular fluid
collection or hematoma.

Biceps Long Head: Intraarticular and extraarticular portions of the
biceps tendon are intact.

Acromioclavicular Joint: Mild arthropathy of the acromioclavicular
joint. No subacromial/subdeltoid bursal fluid.

Glenohumeral Joint: Adequatedistension of the glenohumeral joint.

Labrum: Probable small nondisplaced labral tear just posterior to
the biceps labral anchor (coronal T2 and T1 image 11).

Bones: No fracture or dislocation. No aggressive osseous lesion.

Other: No fluid collection or hematoma.
IMPRESSION: Distal supraspinatus tendinosis with a high-grade, partial width
bursal sided tear at the footprint. No significant muscle atrophy.

Mild subacromial-subdeltoid bursitis.

Probable small nondisplaced labral tear just posterior to the biceps
labral anchor.

Mild AC joint arthropathy.

## 2023-07-04 IMAGING — US US AXILLARY LEFT
1 series · 1 of 1 positions shown · non-contrast
Comparison: Previous exam(s).

ACR Breast Density Category a: The breast tissue is almost entirely
fatty.

CLINICAL DATA: Swelling in the left axilla.

EXAM:
DIGITAL DIAGNOSTIC BILATERAL MAMMOGRAM WITH TOMOSYNTHESIS AND CAD;
US AXILLARY LEFT
TECHNIQUE: Bilateral digital diagnostic mammography and breast tomosynthesis
was performed. The images were evaluated with computer-aided
detection.; Targeted ultrasound examination of the left axilla was
performed.

[Series 1: us axillary left · 0.07mm/px · 1 of 1 slices shown]
[im 1/1]
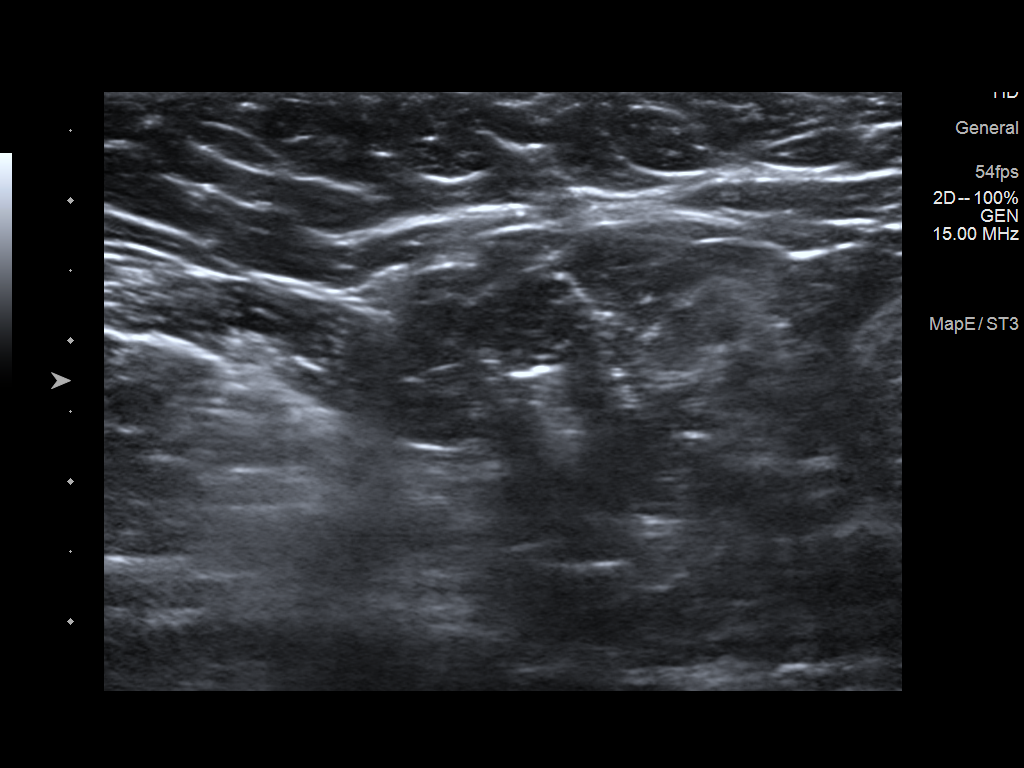

[1 of 1 positions shown; findings below may reference images not displayed]

FINDINGS: No suspicious masses, calcifications, or or distortion identified in
either breast. No significant gynecomastia.

Targeted ultrasound is performed, showing normal lymph nodes and
anatomic structures in the left axilla. No evidence of malignancy or
adenopathy.
IMPRESSION: No evidence of malignancy.

RECOMMENDATION:
Treatment of the patient's symptoms should be based on clinical and
physical exam given lack of imaging findings. No imaging follow-up
necessary.

I have discussed the findings and recommendations with the patient.
If applicable, a reminder letter will be sent to the patient
regarding the next appointment.

BI-RADS CATEGORY  1: Negative.

## 2023-10-12 ENCOUNTER — Encounter: Payer: Self-pay | Admitting: Sports Medicine

## 2023-10-18 ENCOUNTER — Ambulatory Visit: Admitting: Podiatry

## 2023-10-21 ENCOUNTER — Ambulatory Visit: Admitting: Podiatry

## 2023-10-21 DIAGNOSIS — L209 Atopic dermatitis, unspecified: Secondary | ICD-10-CM | POA: Diagnosis not present

## 2023-10-21 DIAGNOSIS — B070 Plantar wart: Secondary | ICD-10-CM | POA: Diagnosis not present

## 2023-10-21 NOTE — Progress Notes (Signed)
 Patient presents with multiple complaints feet bilaterally.  Complains of 2 warts on the plantar aspect of the forefoot under the first MTP and hallux left.  Has been bothering him for several months now.  He tried some over-the-counter compounds without success.  Also complains of a rash developing around the toes and also on the plantar posterior aspect of the heel on the right.  He said he injured the skin on the posterior aspect of the heel on the right but it has been getting worse and with flaky and peeling skin and pain the other irritated areas with rash are also painful.  No history of psoriasis.  Also complains of thick dystrophic nails 1 through 5 bilaterally   Physical exam:  General appearance: Pleasant, and in no acute distress. AOx3.  Vascular: Pedal pulses: DP 2/4 bilaterally, PT 2/4 bilaterally.  No edema lower legs bilaterally. Capillary fill time immediate bilaterally.  Neurological: Light touch intact feet bilaterally.  Normal Achilles reflex bilaterally.  No clonus or spasticity noted.   Dermatologic:   2 verrucous lesions with pinpoint hemorrhages and skin lines going around the lesions and with lateral pressure on the lesions at the plantar sulcus of the hallux and first MTP left.  Erythematous scaly rash on the posterior aspect of the heel right and around multiple toes bilaterally.  Nails thick dystrophic with onychomycotic changes 1 through 5 bilaterally skin normal temperature bilaterally.  Skin normal color, tone, and texture bilaterally.   Musculoskeletal: Normal muscle strength lower extremity bilaterally    Diagnosis: 1.  Atopic dermatitis feet bilaterally 2.  Plantar verruca left foot x 2  Plan: -New patient office visit for evaluation and management level 3.  Modifier 25.  -Rx clobetasol  0.5% ointment, apply twice daily to affected areas feet bilaterally, 1 refill  -Applied Salinocaine compound to lesion(s) as noted in physical exam after debriding  lesions to pinpoint bleeding.  Salinocaine applied to lesion(s) and covered with an occlusive dressing with Coban wrap.  Written and oral instructions given to patient.    Return 2 weeks follow-up warts left and atopic dermatitis bilaterally

## 2023-10-22 ENCOUNTER — Other Ambulatory Visit: Payer: Self-pay

## 2023-10-22 ENCOUNTER — Telehealth: Payer: Self-pay | Admitting: Podiatry

## 2023-10-22 MED ORDER — CLOBETASOL PROP EMOLLIENT BASE 0.05 % EX CREA: 2.0000 | TOPICAL_CREAM | Freq: Two times a day (BID) | CUTANEOUS | 2 refills | Status: AC

## 2023-10-22 NOTE — Addendum Note (Signed)
 Addended by: Berline Semrad L on: 10/22/2023 09:45 AM   Modules accepted: Orders

## 2023-10-22 NOTE — Telephone Encounter (Signed)
 Following was supposed to be sent to pharmacy on file and they have not received: -Rx clobetasol  0.5% ointment, apply twice daily to affected areas feet bilaterally, 1 refill   Patient states his feet is killing him and asking if it can be sent right away, saying it was supposed to be sent yesterday.

## 2023-11-04 ENCOUNTER — Ambulatory Visit: Admitting: Podiatry

## 2023-11-04 ENCOUNTER — Encounter: Payer: Self-pay | Admitting: Podiatry

## 2023-11-04 DIAGNOSIS — B070 Plantar wart: Secondary | ICD-10-CM

## 2023-11-04 NOTE — Progress Notes (Signed)
 Presents today follow-up warts x 2 medial aspect plantar foot left and atopic dermatitis feet bilaterally.  Rash is doing much better since he started using clobetasol  2 weeks ago warts seem better they are not sore right now.   Physical exam:  General appearance: Pleasant, and in no acute distress. AOx3.  Vascular: Pedal pulses: DP 2/4 bilaterally, PT 2/4 bilaterally.  Normal edema lower legs bilaterally. Capillary fill time immediate bilaterally.  Neurological: Grossly intact bilaterally  Dermatologic:   Areas of rash on the feet bilaterally have considered improved considerably with less erythema no vesicles no bulla or vesicles noted.  Some loose peeling skin but much improved with less inflammation.  Verrucous lesions are improved reduced in size.  Still has pinpoint hemorrhages and skin lines going around the lesions subhallux and sulcus first MTP left  Musculoskeletal:      Diagnosis: 1.  Plantar verruca x 2 left foot  Plan: -Doing well with the clobetasol  for the atopic dermatitis seems to be getting it under control.  Responded well to the Salinocaine we will do a second treatment today -Applied Salinocaine compound to lesion(s) as noted in physical exam after debriding lesions to pinpoint bleeding.  Salinocaine applied to lesion(s) and covered with an occlusive dressing with Coban wrap.  Written and oral instructions given to patient.     Return 2 weeks follow-up verruca and potentially start on Lamisil

## 2023-11-19 ENCOUNTER — Ambulatory Visit: Admitting: Podiatry

## 2023-11-19 VITALS — Ht 74.0 in | Wt 207.0 lb

## 2023-11-19 DIAGNOSIS — M79671 Pain in right foot: Secondary | ICD-10-CM

## 2023-11-19 DIAGNOSIS — B351 Tinea unguium: Secondary | ICD-10-CM

## 2023-11-19 DIAGNOSIS — M79672 Pain in left foot: Secondary | ICD-10-CM

## 2023-11-19 DIAGNOSIS — L209 Atopic dermatitis, unspecified: Secondary | ICD-10-CM

## 2023-11-19 DIAGNOSIS — B070 Plantar wart: Secondary | ICD-10-CM

## 2023-11-19 LAB — HEPATIC FUNCTION PANEL
AG Ratio: 1.6 (calc) (ref 1.0–2.5)
ALT: 20 U/L (ref 9–46)
AST: 21 U/L (ref 10–35)
Albumin: 4.5 g/dL (ref 3.6–5.1)
Alkaline phosphatase (APISO): 69 U/L (ref 35–144)
Bilirubin, Direct: 0.1 mg/dL (ref 0.0–0.2)
Globulin: 2.8 g/dL (ref 1.9–3.7)
Indirect Bilirubin: 0.4 mg/dL (ref 0.2–1.2)
Total Bilirubin: 0.5 mg/dL (ref 0.2–1.2)
Total Protein: 7.3 g/dL (ref 6.1–8.1)

## 2023-11-19 MED ORDER — TRIAMCINOLONE ACETONIDE 0.5 % EX OINT: 1.0000 | TOPICAL_OINTMENT | Freq: Two times a day (BID) | CUTANEOUS | 3 refills | Status: AC

## 2023-11-19 MED ORDER — TERBINAFINE HCL 250 MG PO TABS: 250.0000 mg | ORAL_TABLET | Freq: Every day | ORAL | 1 refills | Status: AC

## 2023-11-19 NOTE — Addendum Note (Signed)
 Addended by: JASMINE VERNELL PARAS on: 11/19/2023 08:22 AM   Modules accepted: Orders

## 2023-11-19 NOTE — Progress Notes (Signed)
   Subjective:    HPI Patient presents today for follow-up plantar verruca hallux left.  Also complains of the the rash started to come back again.  Complains of nails that are thick dystrophic discolored and bother him get sore when walking and wearing shoes.  Objective:  Physical Exam   General: AAO x3, NAD  Vascular: DP and PT pulses palpable bilaterally.  Immedate capillary fill time digits. No significant lower extremity edema bilaterally.  Dermatological: Onychomycotic mycotic changes nails 1 through 5 with discoloration nail and subungual debris and thickening of the nail. Neruologic: Grossly intact B/L verrucous lesions hallux left appear to have resolved.  There is a some the atopic dermatitis is returning around the heels and in the sulcus of the foot bilaterally.  Musculoskeletal:   Assessment:  Painful onychomycotic nails 1 through 5 bilaterally. Pain feet b/l Plantar verruca x 2 left foot     Plan:  -Established office visit level 3 for evaluation and management. -Discussed onychomycosis with the patient.  Discussed etiology and treatment.  Discussed topical versus oral antifungals.  Discussed risk and benefits of both.  Discussed Lamisil patient would like to pursue Lamisil treatment.  Will do periodic lab testing to check for any side effects as a precaution while taking Lamisil.  He can also start using clobetasol  on the areas.  Recommend this in the acute phase.  If he start getting outbreaks again I recommend he use triamcinolone  cream to see if this brings to controls and still milder corticosteroid. -Rx: Lamisil 250 mg p.o. daily - Labs ordered today for liver function tests to monitor for any hepatic side effects from the Lamisil. -Rx triamcinolone  cream 0.5%, apply twice daily to affected area feet, refill x 3  - Return 6 weeks Lamisil 3 and labs

## 2023-12-31 ENCOUNTER — Encounter: Payer: Self-pay | Admitting: Podiatry

## 2023-12-31 ENCOUNTER — Ambulatory Visit: Admitting: Podiatry

## 2023-12-31 DIAGNOSIS — B351 Tinea unguium: Secondary | ICD-10-CM

## 2023-12-31 DIAGNOSIS — M79671 Pain in right foot: Secondary | ICD-10-CM

## 2023-12-31 DIAGNOSIS — M79672 Pain in left foot: Secondary | ICD-10-CM | POA: Diagnosis not present

## 2023-12-31 DIAGNOSIS — Z79899 Other long term (current) drug therapy: Secondary | ICD-10-CM | POA: Diagnosis not present

## 2023-12-31 MED ORDER — TERBINAFINE HCL 250 MG PO TABS: 250.0000 mg | ORAL_TABLET | Freq: Every day | ORAL | 0 refills | Status: AC

## 2023-12-31 NOTE — Progress Notes (Signed)
   Subjective:    HPI Presents for follow-up onychomycosis treatment with p.o. Lamisil .  No problems taking medicine with no side effects noted.  Tenderness around toes with walking and wearing shoes.   Objective:  Physical Exam   General: AAO x3, NAD  Vascular: DP and PT pulses palpable bilaterally.  Immedate capillary fill time digits. No significant lower extremity edema bilaterally.  Dermatological: Onychomycotic mycotic changes nails 1 through 5 with discoloration nail and subungual debris and thickening of the nail. 0% Clearance of onychomycotic nail changes noted. Tenderness with walking and wearing shoes.  Neruologic: Grossly intact B/L  Musculoskeletal:   Assessment:  Painful onychomycotic nails 1 through 5 bilaterally. Pain feet b/l     Plan:  -Established office visit level 3 for evaluation and management -Patient is tolerating Lamisil  treatment for onychomycotic nails well.  No side effects noted. Will continue this treatment. -Rx: Lamisil  250 mg p.o. daily - Labs ordered today for liver function tests to monitor for any hepatic side effects from the Lamisil .  - Return in 6weeks for  Lamisil  final

## 2024-01-10 LAB — HEPATIC FUNCTION PANEL
AG Ratio: 1.8 (calc) (ref 1.0–2.5)
ALT: 22 U/L (ref 9–46)
AST: 21 U/L (ref 10–35)
Albumin: 4.5 g/dL (ref 3.6–5.1)
Alkaline phosphatase (APISO): 65 U/L (ref 35–144)
Bilirubin, Direct: 0.1 mg/dL (ref 0.0–0.2)
Globulin: 2.5 g/dL (ref 1.9–3.7)
Indirect Bilirubin: 0.5 mg/dL (ref 0.2–1.2)
Total Bilirubin: 0.6 mg/dL (ref 0.2–1.2)
Total Protein: 7 g/dL (ref 6.1–8.1)

## 2024-02-18 ENCOUNTER — Ambulatory Visit: Admitting: Podiatry
